# Patient Record
Sex: Female | Born: 1965 | Hispanic: Refuse to answer | Smoking: Never smoker
Health system: Southern US, Community
[De-identification: ages and names within clinical notes are randomized; demographics above are authoritative.]

## PROBLEM LIST (undated history)

## (undated) HISTORY — PX: NO PAST SURGERIES: SHX2092

---

## 2017-01-31 ENCOUNTER — Ambulatory Visit
Admission: EM | Admit: 2017-01-31 | Discharge: 2017-01-31 | Disposition: A | Discharge status: Home / Self Care | Payer: Worker's Compensation | Attending: Family Medicine | Admitting: Family Medicine

## 2017-01-31 ENCOUNTER — Ambulatory Visit (INDEPENDENT_AMBULATORY_CARE_PROVIDER_SITE_OTHER): Payer: Worker's Compensation

## 2017-01-31 DIAGNOSIS — S8001XA Contusion of right knee, initial encounter: Secondary | ICD-10-CM

## 2017-01-31 DIAGNOSIS — M25561 Pain in right knee: Secondary | ICD-10-CM

## 2017-01-31 DIAGNOSIS — W19XXXA Unspecified fall, initial encounter: Secondary | ICD-10-CM | POA: Diagnosis not present

## 2017-01-31 MED ORDER — TRAMADOL HCL 50 MG PO TABS: 50.0000 mg | ORAL_TABLET | Freq: Every evening | ORAL | 0 refills | Status: DC | PRN

## 2017-01-31 MED ORDER — MELOXICAM 15 MG PO TABS: 15.0000 mg | ORAL_TABLET | Freq: Every day | ORAL | 0 refills | Status: DC | PRN

## 2017-01-31 NOTE — ED Provider Notes (Signed)
[facility] ____________________________________________  Time seen: Approximately 2:20 PM  I have reviewed the triage vital signs and the nursing notes.   HISTORY  Chief Complaint Work Related Injury; Knee Pain (right); and Elbow Pain  Spanish interpreter used  HPI Tina Coleman is a 51 y.o. female presenting for evaluation of right knee pain. 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 Denies other pain or injury. Denies head injury or loss of consciousness. Reports pain only to right knee. States current right knee pain is mild, moderate with active weightbearing or direct palpation. States right knee is swollen since injury. Denies previous knee issues. States has continued to weight-bear and ambulate but with the pain. Denies feeling the need of crutches. No medications taken over-the-counter prior to arrival. Denies other aggravating or alleviating factors. Reports otherwise feels well.  Denies chest pain, shortness of breath, abdominal pain, or rash. Denies recent sickness. Denies recent antibiotic use.   No LMP recorded. Patient is postmenopausal.   History reviewed. No pertinent past medical history.  There are no active problems to display for this patient.   Past Surgical History:  Procedure Laterality Date  . NO PAST SURGERIES       No current facility-administered medications for this encounter.   Current Outpatient Prescriptions:  .  meloxicam (MOBIC) 15 MG tablet, Take 1 tablet (15 mg total) by mouth daily as needed., Disp: 10 tablet, Rfl: 0 .  traMADol (ULTRAM) 50 MG tablet, Take 1 tablet (50 mg total) by mouth at bedtime as needed (Do not drive or operate machinery while taking as can cause drowsiness.)., Disp: 8 tablet, Rfl: 0  Allergies Patient has no known allergies.  History reviewed. No  pertinent family history.  Social History Social History  Substance Use Topics  . Smoking status: Never Smoker  . Smokeless tobacco: Never Used  . Alcohol use Yes     Comment: occasionally    Review of Systems Constitutional: No fever/chills Cardiovascular: Denies chest pain. Respiratory: Denies shortness of breath. Gastrointestinal: No abdominal pain.   Genitourinary: Negative for dysuria. Musculoskeletal: Negative for back pain.As above. Skin: Negative for rash.   ____________________________________________   PHYSICAL EXAM:  VITAL SIGNS: ED Triage Vitals [01/31/17 1406]  Enc Vitals Group     BP (!) 153/93     Pulse Rate 83     Resp 17     Temp 98.4 F (36.9 C)     Temp Source Oral     SpO2 100 %     Weight 174 lb (78.9 kg)     Height      Head Circumference      Peak Flow      Pain Score 6     Pain Loc      Pain Edu?      Excl. in GC?     Constitutional: Alert and oriented. Well appearing and in no acute distress. Cardiovascular: Normal rate, regular rhythm. Grossly normal heart sounds.  Good peripheral circulation. Respiratory: Normal respiratory effort without tachypnea nor retractions. Breath sounds are clear and equal bilaterally. No wheezes, rales, rhonchi. Musculoskeletal: No midline cervical, thoracic or lumbar tenderness to palpation. Bilateral pedal pulses equal and easily palpated.  except: Right anterior knee with diffuse tenderness and mild edema, tenderness more focal at mild to moderate on anterior medial aspect and along  the lower patella, pain with resisted knee flexion and extension but full range of motion present, no pain with anterior or posterior drawer test, no medial or lateral stress pain. Ambulatory with mild antalgic gait. Right lower me otherwise nontender. Neurologic:  Normal speech and language. Speech is normal. No gait instability.  Skin:  Skin is warm, dry. Psychiatric: Mood and affect are normal. Speech and behavior are normal.  Patient exhibits appropriate insight and judgment   ___________________________________________   LABS (all labs ordered are listed, but only abnormal results are displayed)  Labs Reviewed - No data to display  RADIOLOGY  Dg Knee Complete 4 Views Right  Result Date: 01/31/2017 CLINICAL DATA:  Right knee pain.  Fall. EXAM: RIGHT KNEE - COMPLETE 4+ VIEW COMPARISON:  None. FINDINGS: No evidence of fracture, dislocation, or joint effusion. No evidence of arthropathy or other focal bone abnormality. Soft tissues are unremarkable. IMPRESSION: Negative. Electronically Signed   By: Charlett Nose M.D.   On: 01/31/2017 14:58   ____________________________________________   PROCEDURES Procedures   INITIAL IMPRESSION / ASSESSMENT AND PLAN / ED COURSE  Pertinent labs & imaging results that were available during my care of the patient were reviewed by me and considered in my medical decision making (see chart for details).  Very well-appearing patient. No acute distress. Presents for evaluation of right knee pain post mechanical injury that occurred at work prior to arrival. 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 Denies other pain or injury. Will evaluate right knee x-ray.  Right knee x-ray negative per radiologist. Discussed with patient contusion and strain injuries likely. Encouraged rest, ice and elevation. Will apply a knee immobilizer. Will start patient on oral daily Mobic and when necessary tramadol at night for breakthrough pain. Discussed no bending, climbing or squatting with right leg  And to continue knee immobilizer for 3 days and until follow-up with Tommie Maximiano Coss NP. Encouraged rest, fluids and supportive care.   [place] controlled substance database reviewed and no recent controlled medications documented.  Discussed follow up and return parameters including no resolution or any worsening concerns. Patient verbalized understanding and agreed to plan.    ____________________________________________   FINAL CLINICAL IMPRESSION(S) / ED DIAGNOSES  Final diagnoses:  Acute pain of right knee  Contusion of right knee, initial encounter     New Prescriptions   MELOXICAM (MOBIC) 15 MG TABLET    Take 1 tablet (15 mg total) by mouth daily as needed.   TRAMADOL (ULTRAM) 50 MG TABLET    Take 1 tablet (50 mg total) by mouth at bedtime as needed (Do not drive or operate machinery while taking as can cause drowsiness.).    Note: This dictation was prepared with Dragon dictation along with smaller phrase technology. Any transcriptional errors that result from this process are unintentional.          Renford Dills, NP 01/31/17 1604

## 2017-01-31 NOTE — Discharge Instructions (Signed)
Take medication as prescribed. Rest. Drink plenty of fluids. Apply ice and elevate.   Follow up with Tommie Maximiano CossAnne Moore NP in 3 days. Wear knee splint until follow up, with except of showering and stretching.   Follow up with your primary care physician this week as needed. Return to Urgent care for new or worsening concerns.

## 2017-01-31 NOTE — ED Triage Notes (Signed)
Patient complains of right knee pain after a fall at work today while picking up a box while putting it on top of another box. Patient states that this occurred around 11:45am.

## 2017-02-21 ENCOUNTER — Other Ambulatory Visit: Payer: Self-pay | Admitting: Obstetrics and Gynecology

## 2017-02-21 DIAGNOSIS — Z1231 Encounter for screening mammogram for malignant neoplasm of breast: Secondary | ICD-10-CM

## 2017-03-07 ENCOUNTER — Ambulatory Visit
Admission: RE | Admit: 2017-03-07 | Discharge: 2017-03-07 | Disposition: A | Discharge status: Home / Self Care | Payer: 59 | Source: Ambulatory Visit | Attending: Obstetrics and Gynecology | Admitting: Obstetrics and Gynecology

## 2017-03-07 DIAGNOSIS — Z1231 Encounter for screening mammogram for malignant neoplasm of breast: Secondary | ICD-10-CM | POA: Insufficient documentation

## 2017-09-19 ENCOUNTER — Ambulatory Visit: Payer: 59 | Admitting: Anesthesiology

## 2017-09-19 ENCOUNTER — Encounter
Admission: RE | Disposition: A | Discharge status: Home / Self Care | Payer: Self-pay | Source: Ambulatory Visit | Attending: Unknown Physician Specialty

## 2017-09-19 ENCOUNTER — Ambulatory Visit
Admission: RE | Admit: 2017-09-19 | Discharge: 2017-09-19 | Disposition: A | Discharge status: Home / Self Care | Payer: 59 | Source: Ambulatory Visit | Attending: Unknown Physician Specialty | Admitting: Unknown Physician Specialty

## 2017-09-19 DIAGNOSIS — K5730 Diverticulosis of large intestine without perforation or abscess without bleeding: Secondary | ICD-10-CM | POA: Diagnosis not present

## 2017-09-19 DIAGNOSIS — K640 First degree hemorrhoids: Secondary | ICD-10-CM | POA: Diagnosis not present

## 2017-09-19 DIAGNOSIS — Z1211 Encounter for screening for malignant neoplasm of colon: Secondary | ICD-10-CM | POA: Insufficient documentation

## 2017-09-19 HISTORY — PX: COLONOSCOPY WITH PROPOFOL: SHX5780

## 2017-09-19 SURGERY — COLONOSCOPY WITH PROPOFOL
Anesthesia: General

## 2017-09-19 MED ORDER — FENTANYL CITRATE (PF) 100 MCG/2ML IJ SOLN
INTRAMUSCULAR | Status: AC
  Filled 2017-09-19: qty 2

## 2017-09-19 MED ORDER — PROPOFOL 500 MG/50ML IV EMUL
INTRAVENOUS | Status: DC | PRN
  Administered 2017-09-19: 180 ug/kg/min via INTRAVENOUS

## 2017-09-19 MED ORDER — FENTANYL CITRATE (PF) 100 MCG/2ML IJ SOLN
INTRAMUSCULAR | Status: DC | PRN
  Administered 2017-09-19: 50 ug via INTRAVENOUS

## 2017-09-19 MED ORDER — MIDAZOLAM HCL 2 MG/2ML IJ SOLN
INTRAMUSCULAR | Status: DC | PRN
  Administered 2017-09-19: 2 mg via INTRAVENOUS

## 2017-09-19 MED ORDER — SODIUM CHLORIDE 0.9 % IV SOLN: INTRAVENOUS | Status: DC

## 2017-09-19 MED ORDER — SODIUM CHLORIDE 0.9 % IV SOLN
INTRAVENOUS | Status: DC
  Administered 2017-09-19: 1000 mL via INTRAVENOUS
  Administered 2017-09-19: 12:00:00 via INTRAVENOUS

## 2017-09-19 MED ORDER — MIDAZOLAM HCL 2 MG/2ML IJ SOLN
INTRAMUSCULAR | Status: AC
  Filled 2017-09-19: qty 2

## 2017-09-19 MED ORDER — PROPOFOL 10 MG/ML IV BOLUS
INTRAVENOUS | Status: DC | PRN
  Administered 2017-09-19: 30 mg via INTRAVENOUS
  Administered 2017-09-19: 40 mg via INTRAVENOUS

## 2017-09-19 NOTE — Progress Notes (Signed)
Patient waiting on her daughter

## 2017-09-19 NOTE — H&P (Signed)
   Primary Care Physician:  Leotis ShamesSingh, Jasmine, MD Primary Gastroenterologist:  Dr. Mechele CollinElliott  Pre-Procedure History & Physical: HPI:  Tina Coleman is a 52 y.o. female is here for an colonoscopy.   No past medical history on file.  Past Surgical History:  Procedure Laterality Date  . NO PAST SURGERIES      Prior to Admission medications   Medication Sig Start Date End Date Taking? Authorizing Provider  meloxicam (MOBIC) 15 MG tablet Take 1 tablet (15 mg total) by mouth daily as needed. 01/31/17   Renford DillsMiller, Lindsey, NP  traMADol (ULTRAM) 50 MG tablet Take 1 tablet (50 mg total) by mouth at bedtime as needed (Do not drive or operate machinery while taking as can cause drowsiness.). 01/31/17   Renford DillsMiller, Lindsey, NP    Allergies as of 08/02/2017  . (No Known Allergies)    Family History  Problem Relation Age of Onset  . Breast cancer Neg Hx     Social History   Socioeconomic History  . Marital status: Divorced    Spouse name: Not on file  . Number of children: Not on file  . Years of education: Not on file  . Highest education level: Not on file  Social Needs  . Financial resource strain: Not on file  . Food insecurity - worry: Not on file  . Food insecurity - inability: Not on file  . Transportation needs - medical: Not on file  . Transportation needs - non-medical: Not on file  Occupational History  . Not on file  Tobacco Use  . Smoking status: Never Smoker  . Smokeless tobacco: Never Used  Substance and Sexual Activity  . Alcohol use: Yes    Comment: occasionally  . Drug use: No  . Sexual activity: Not on file  Other Topics Concern  . Not on file  Social History Narrative  . Not on file    Review of Systems: See HPI, otherwise negative ROS  Physical Exam: BP (!) 147/81   Pulse 78   Temp 97.7 F (36.5 C) (Tympanic)   Resp 17   Ht 5\' 5"  (1.651 m)   Wt 76.7 kg (169 lb)   SpO2 100%   BMI 28.12 kg/m  General:   Alert,  pleasant and cooperative in NAD Head:   Normocephalic and atraumatic. Neck:  Supple; no masses or thyromegaly. Lungs:  Clear throughout to auscultation.    Heart:  Regular rate and rhythm. Abdomen:  Soft, nontender and nondistended. Normal bowel sounds, without guarding, and without rebound.   Neurologic:  Alert and  oriented x4;  grossly normal neurologically.  Impression/Plan: Tina Coleman is here for an colonoscopy to be performed for colon cancer screening.  Risks, benefits, limitations, and alternatives regarding  colonoscopy have been reviewed with the patient.  Questions have been answered.  All parties agreeable.   Lynnae PrudeELLIOTT, Hagop Mccollam, MD  09/19/2017, 11:36 AM

## 2017-09-19 NOTE — Anesthesia Preprocedure Evaluation (Addendum)
Anesthesia Evaluation  Patient identified by MRN, date of birth, ID band Patient awake    Reviewed: Allergy & Precautions, H&P , NPO status , reviewed documented beta blocker date and time   Airway Mallampati: III  TM Distance: >3 FB     Dental  (+) Caps   Pulmonary    Pulmonary exam normal        Cardiovascular Normal cardiovascular exam     Neuro/Psych    GI/Hepatic   Endo/Other    Renal/GU      Musculoskeletal   Abdominal   Peds  Hematology   Anesthesia Other Findings   Reproductive/Obstetrics                            Anesthesia Physical Anesthesia Plan  ASA: II  Anesthesia Plan: General   Post-op Pain Management:    Induction:   PONV Risk Score and Plan: 3 and Propofol infusion  Airway Management Planned:   Additional Equipment:   Intra-op Plan:   Post-operative Plan:   Informed Consent: I have reviewed the patients History and Physical, chart, labs and discussed the procedure including the risks, benefits and alternatives for the proposed anesthesia with the patient or authorized representative who has indicated his/her understanding and acceptance.   Dental Advisory Given  Plan Discussed with: CRNA  Anesthesia Plan Comments:         Anesthesia Quick Evaluation

## 2017-09-19 NOTE — Anesthesia Postprocedure Evaluation (Signed)
Anesthesia Post Note  Patient: Etta QuillMaria Takemoto  Procedure(s) Performed: COLONOSCOPY WITH PROPOFOL (N/A )  Patient location during evaluation: Endoscopy Anesthesia Type: General Level of consciousness: awake and alert Pain management: pain level controlled Vital Signs Assessment: post-procedure vital signs reviewed and stable Respiratory status: spontaneous breathing, nonlabored ventilation, respiratory function stable and patient connected to nasal cannula oxygen Cardiovascular status: blood pressure returned to baseline and stable Postop Assessment: no apparent nausea or vomiting Anesthetic complications: no     Last Vitals:  Vitals:   09/19/17 1231 09/19/17 1241  BP: (!) 110/50 127/77  Pulse: 67 60  Resp: (!) 27 13  Temp:    SpO2: 100% 100%    Last Pain:  Vitals:   09/19/17 1044  TempSrc: Tympanic                 Dalayza Zambrana Garry Heater Derrica Sieg

## 2017-09-19 NOTE — Anesthesia Post-op Follow-up Note (Signed)
Anesthesia QCDR form completed.        

## 2017-09-19 NOTE — Transfer of Care (Signed)
Immediate Anesthesia Transfer of Care Note  Patient: Tina Coleman  Procedure(s) Performed: COLONOSCOPY WITH PROPOFOL (N/A )  Patient Location: PACU  Anesthesia Type:General  Level of Consciousness: awake  Airway & Oxygen Therapy: Patient Spontanous Breathing and Patient connected to nasal cannula oxygen  Post-op Assessment: Report given to RN and Post -op Vital signs reviewed and stable  Post vital signs: Reviewed and stable  Last Vitals:  Vitals:   09/19/17 1044  BP: (!) 147/81  Pulse: 78  Resp: 17  Temp: 36.5 C  SpO2: 100%    Last Pain:  Vitals:   09/19/17 1044  TempSrc: Tympanic         Complications: No apparent anesthesia complications

## 2017-09-19 NOTE — Anesthesia Procedure Notes (Signed)
Date/Time: 09/19/2017 11:50 AM Performed by: Henrietta HooverPope, Cannon Arreola, CRNA Pre-anesthesia Checklist: Patient identified, Emergency Drugs available, Suction available, Patient being monitored and Timeout performed Oxygen Delivery Method: Nasal cannula Placement Confirmation: positive ETCO2

## 2017-09-19 NOTE — Op Note (Addendum)
[facility] Gastroenterology Patient Name: Tina QuillMaria Coleman Procedure Date: 09/19/2017 11:36 AM MRN: [id number] Account #: [id number] Date of Birth: 08/21/1965 Admit Type: Outpatient Age: [age] [facility] 3 Gender: Female Note Status: Finalized Procedure:            Colonoscopy Indications:          Screening for colorectal malignant neoplasm Providers:            Scot Junobert T. Danna Sewell, MD Referring MD:         Leotis ShamesJasmine Singh (Referring MD) Medicines:            Propofol per Anesthesia Complications:        No immediate complications. Procedure:            Pre-Anesthesia Assessment:                       - After reviewing the risks and benefits, the patient                        was deemed in satisfactory condition to undergo the                        procedure.                       After obtaining informed consent, the colonoscope was                        passed under direct vision. Throughout the procedure,                        the patient's blood pressure, pulse, and oxygen                        saturations were monitored continuously. The                        Colonoscope was introduced through the anus and                        advanced to the the cecum, identified by appendiceal                        orifice and ileocecal valve. The colonoscopy was                        performed without difficulty. The patient tolerated the                        procedure well. The quality of the bowel preparation                        was excellent. Findings:      A few very small-mouthed diverticula were found in the sigmoid colon.      Internal hemorrhoids were found during endoscopy. The hemorrhoids were       small and Grade I (internal hemorrhoids that do not prolapse).      The exam was otherwise without abnormality. Impression:           - Diverticulosis in the sigmoid colon.                       -  Internal hemorrhoids.                       - The  examination was otherwise normal.                       - No specimens collected. Recommendation:       - Repeat colonoscopy in 10 years for screening                        purposes. Repeat in 5 years if a close relative has                        colon polyps or colon cancer. Scot Jun, MD 09/19/2017 12:06:38 PM This report has been signed electronically. Number of Addenda: 0 Note Initiated On: 09/19/2017 11:36 AM Scope Withdrawal Time: 0 hours 6 minutes 16 seconds  Total Procedure Duration: 0 hours 13 minutes 10 seconds       [facility]

## 2017-09-20 ENCOUNTER — Encounter: Payer: Self-pay | Admitting: Unknown Physician Specialty

## 2018-08-09 ENCOUNTER — Other Ambulatory Visit: Payer: Self-pay | Admitting: Internal Medicine

## 2018-08-09 DIAGNOSIS — Z1231 Encounter for screening mammogram for malignant neoplasm of breast: Secondary | ICD-10-CM

## 2018-08-30 ENCOUNTER — Ambulatory Visit
Admission: RE | Admit: 2018-08-30 | Discharge: 2018-08-30 | Disposition: A | Discharge status: Home / Self Care | Payer: BLUE CROSS/BLUE SHIELD | Source: Ambulatory Visit | Attending: Internal Medicine | Admitting: Internal Medicine

## 2018-08-30 DIAGNOSIS — Z1231 Encounter for screening mammogram for malignant neoplasm of breast: Secondary | ICD-10-CM | POA: Insufficient documentation

## 2020-10-28 ENCOUNTER — Other Ambulatory Visit: Payer: Self-pay | Admitting: Internal Medicine

## 2020-10-28 DIAGNOSIS — Z1231 Encounter for screening mammogram for malignant neoplasm of breast: Secondary | ICD-10-CM

## 2020-11-29 ENCOUNTER — Other Ambulatory Visit: Payer: Self-pay

## 2020-11-29 ENCOUNTER — Ambulatory Visit
Admission: RE | Admit: 2020-11-29 | Discharge: 2020-11-29 | Disposition: A | Discharge status: Home / Self Care | Payer: BC Managed Care – PPO | Source: Ambulatory Visit | Attending: Internal Medicine | Admitting: Internal Medicine

## 2020-11-29 DIAGNOSIS — Z1231 Encounter for screening mammogram for malignant neoplasm of breast: Secondary | ICD-10-CM | POA: Diagnosis not present

## 2021-12-28 ENCOUNTER — Other Ambulatory Visit: Payer: Self-pay | Admitting: Internal Medicine

## 2021-12-28 DIAGNOSIS — Z1231 Encounter for screening mammogram for malignant neoplasm of breast: Secondary | ICD-10-CM

## 2022-02-01 ENCOUNTER — Ambulatory Visit
Admission: RE | Admit: 2022-02-01 | Discharge: 2022-02-01 | Disposition: A | Discharge status: Home / Self Care | Payer: BC Managed Care – PPO | Source: Ambulatory Visit | Attending: Internal Medicine | Admitting: Internal Medicine

## 2022-02-01 DIAGNOSIS — Z1231 Encounter for screening mammogram for malignant neoplasm of breast: Secondary | ICD-10-CM | POA: Insufficient documentation

## 2022-02-17 ENCOUNTER — Other Ambulatory Visit: Payer: Self-pay | Admitting: Internal Medicine

## 2022-02-17 DIAGNOSIS — N63 Unspecified lump in unspecified breast: Secondary | ICD-10-CM

## 2022-02-17 DIAGNOSIS — R928 Other abnormal and inconclusive findings on diagnostic imaging of breast: Secondary | ICD-10-CM

## 2022-03-06 ENCOUNTER — Other Ambulatory Visit: Payer: BC Managed Care – PPO

## 2022-10-14 IMAGING — MG MM DIGITAL SCREENING BILAT W/ TOMO AND CAD
8 series · 8 of 24 positions shown · non-contrast
Comparison: Previous exam(s).

CLINICAL DATA: Screening.

EXAM:
DIGITAL SCREENING BILATERAL MAMMOGRAM WITH TOMOSYNTHESIS AND CAD
TECHNIQUE: Bilateral screening digital craniocaudal and mediolateral oblique
mammograms were obtained. Bilateral screening digital breast
tomosynthesis was performed. The images were evaluated with
computer-aided detection.

[R CC synth-2D]
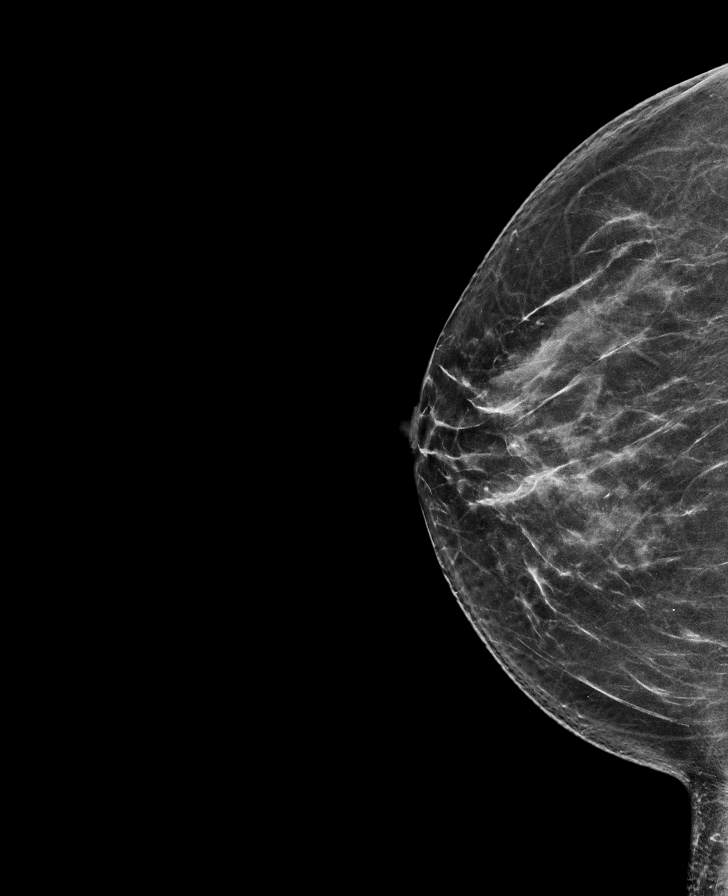

[L MLO synth-2D]
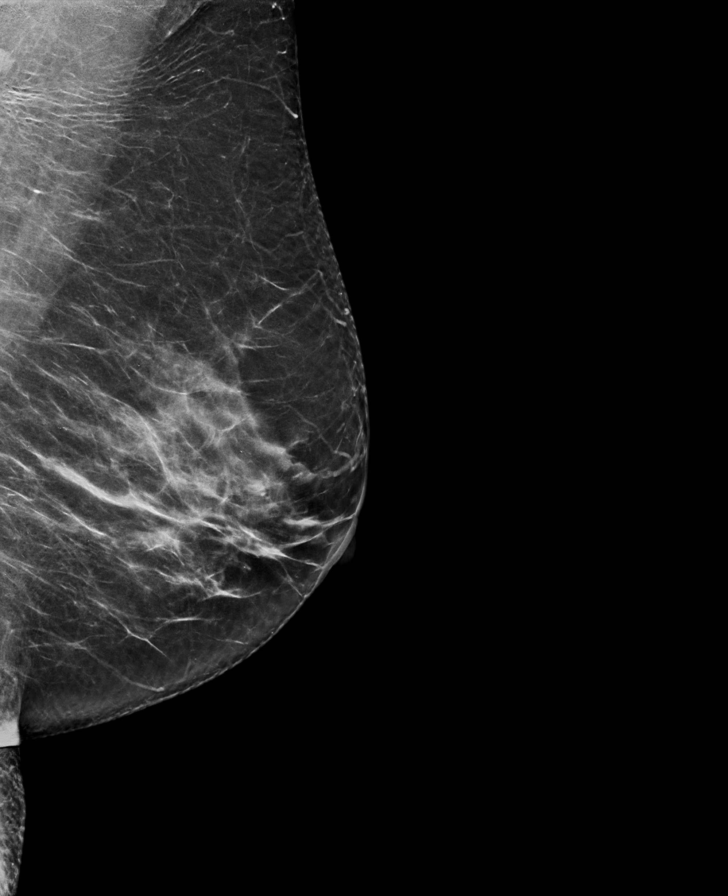

[L CC synth-2D]
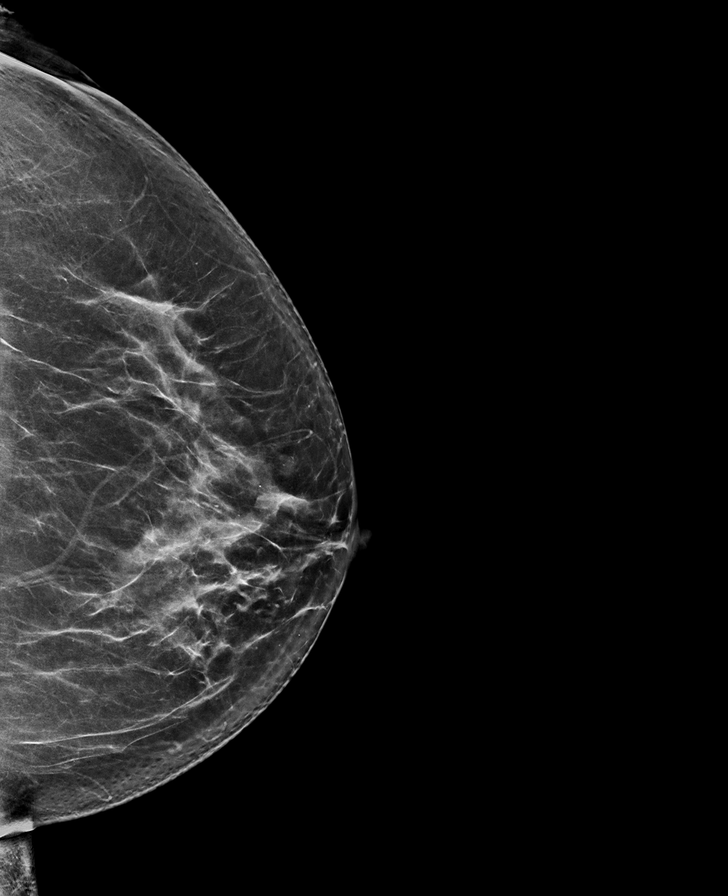

[R MLO synth-2D]
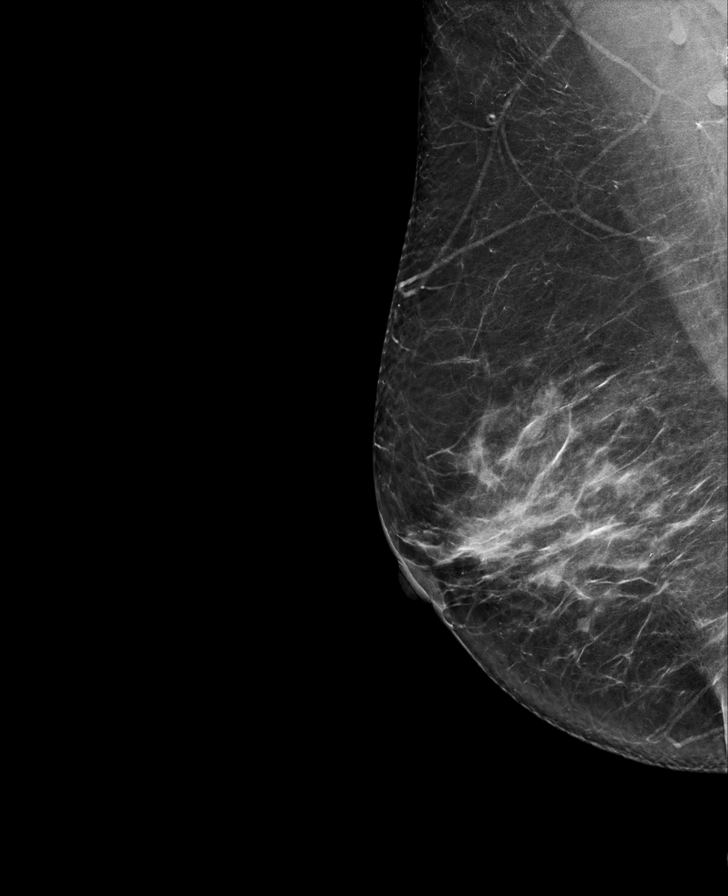

[L CC tomo · tomo slice 37/74.0]
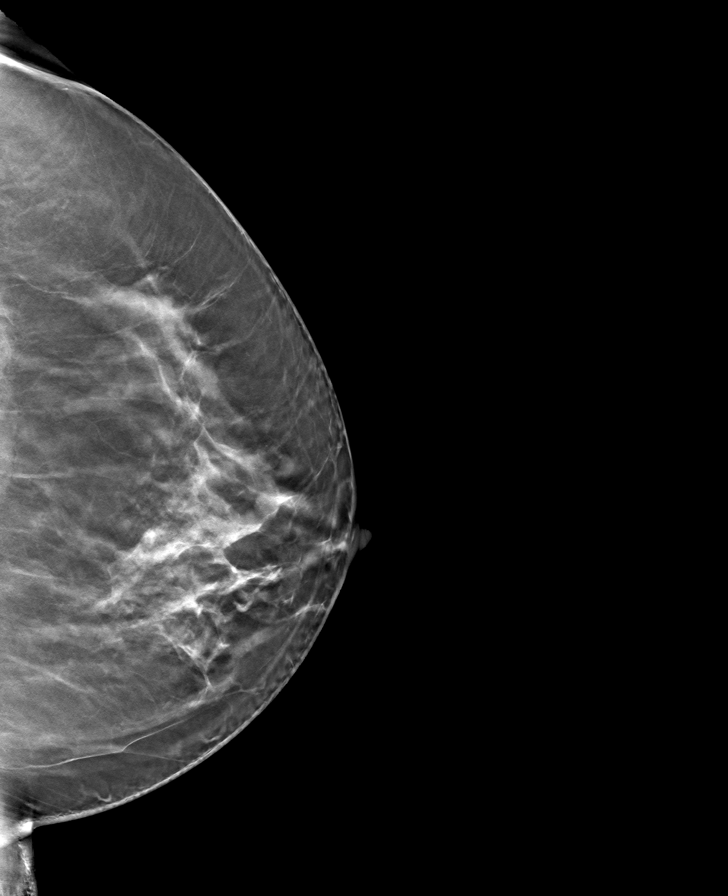

[L MLO tomo · tomo slice 37/74.0]
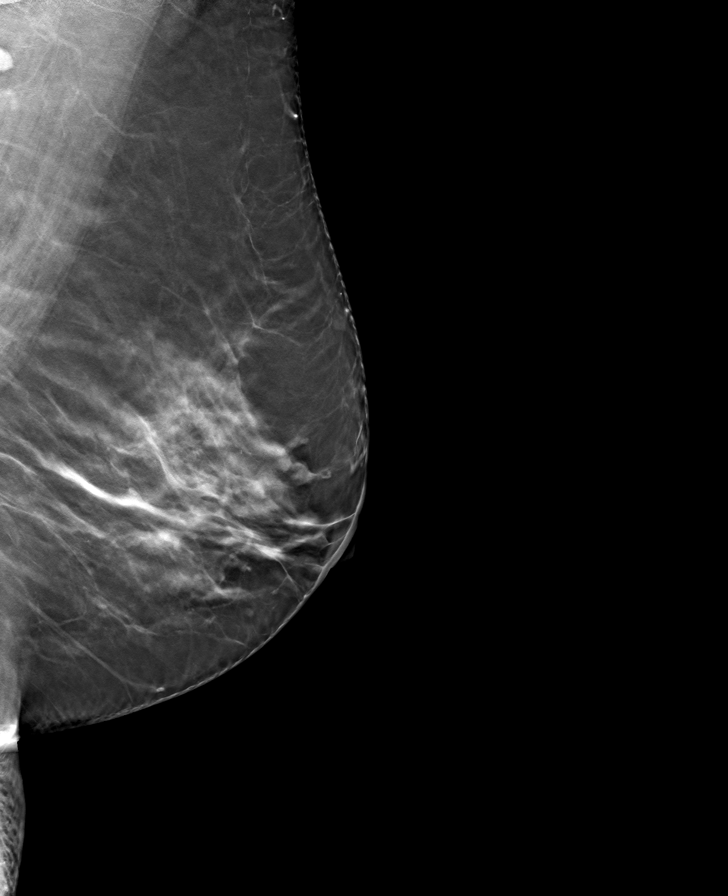

[R CC tomo · tomo slice 35/70.0]
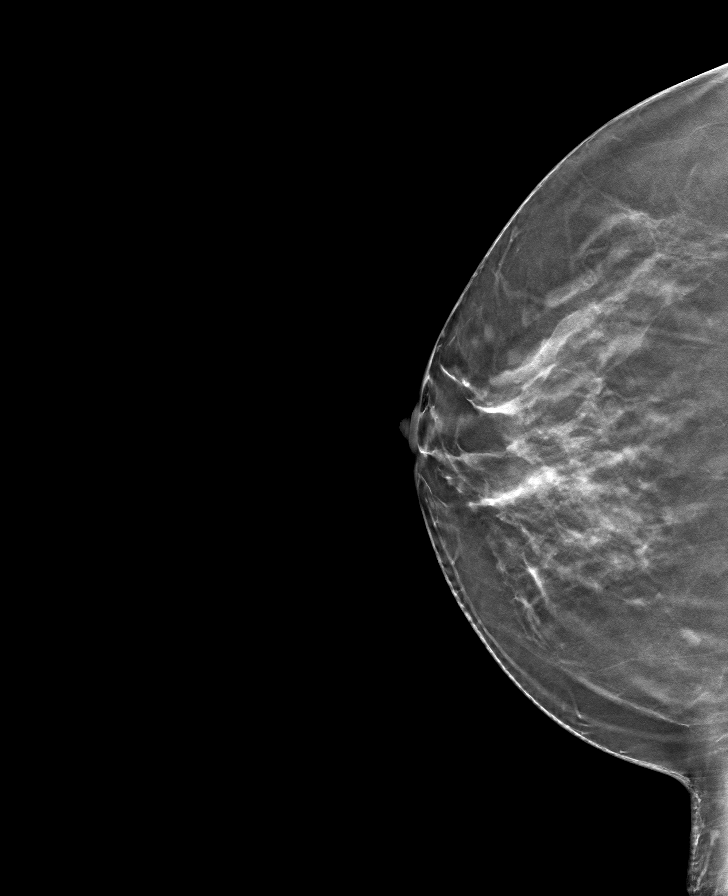

[R MLO tomo · tomo slice 37/73.0]
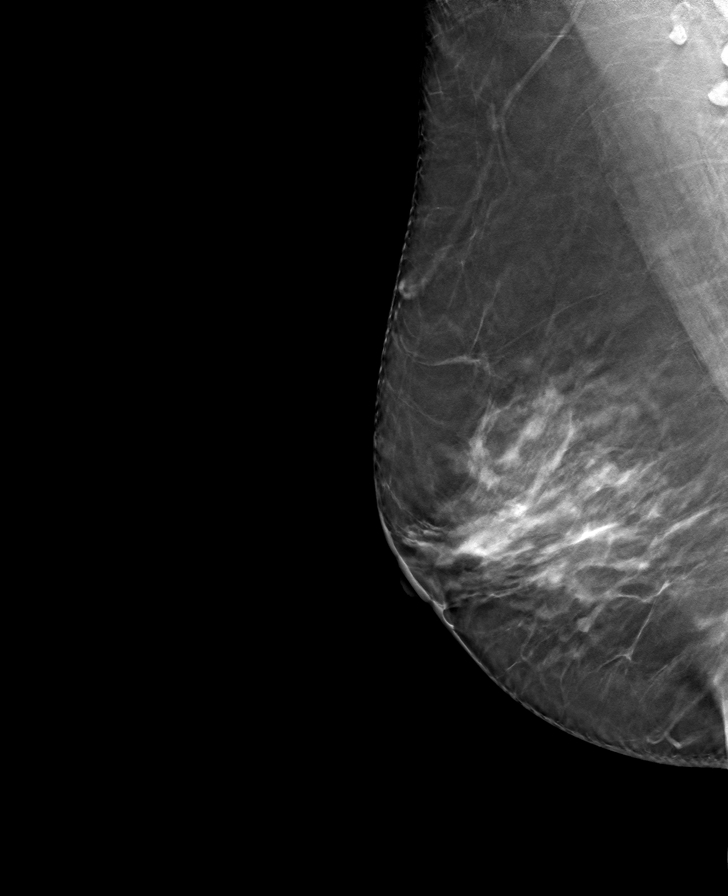

[8 of 24 positions shown; findings below may reference images not displayed]

ACR Breast Density Category c: The breast tissue is heterogeneously
dense, which may obscure small masses.
FINDINGS: There are no findings suspicious for malignancy. The images were
evaluated with computer-aided detection.
IMPRESSION: No mammographic evidence of malignancy. A result letter of this
screening mammogram will be mailed directly to the patient.

RECOMMENDATION:
Screening mammogram in one year. (Code:T4-5-GWO)

BI-RADS CATEGORY  1: Negative.

## 2022-11-30 DIAGNOSIS — L089 Local infection of the skin and subcutaneous tissue, unspecified: Secondary | ICD-10-CM | POA: Diagnosis not present

## 2022-11-30 DIAGNOSIS — L03116 Cellulitis of left lower limb: Secondary | ICD-10-CM | POA: Diagnosis not present

## 2022-11-30 DIAGNOSIS — T07XXXA Unspecified multiple injuries, initial encounter: Secondary | ICD-10-CM | POA: Diagnosis not present

## 2022-11-30 DIAGNOSIS — W57XXXA Bitten or stung by nonvenomous insect and other nonvenomous arthropods, initial encounter: Secondary | ICD-10-CM | POA: Diagnosis not present

## 2023-01-02 DIAGNOSIS — Z780 Asymptomatic menopausal state: Secondary | ICD-10-CM | POA: Diagnosis not present

## 2023-01-02 DIAGNOSIS — Z0000 Encounter for general adult medical examination without abnormal findings: Secondary | ICD-10-CM | POA: Diagnosis not present

## 2023-01-02 DIAGNOSIS — E782 Mixed hyperlipidemia: Secondary | ICD-10-CM | POA: Diagnosis not present

## 2023-01-25 DIAGNOSIS — Z124 Encounter for screening for malignant neoplasm of cervix: Secondary | ICD-10-CM | POA: Diagnosis not present

## 2023-01-25 DIAGNOSIS — Z01419 Encounter for gynecological examination (general) (routine) without abnormal findings: Secondary | ICD-10-CM | POA: Diagnosis not present

## 2023-01-25 DIAGNOSIS — Z1331 Encounter for screening for depression: Secondary | ICD-10-CM | POA: Diagnosis not present

## 2023-02-12 ENCOUNTER — Other Ambulatory Visit: Payer: Self-pay | Admitting: Internal Medicine

## 2023-02-12 DIAGNOSIS — Z1231 Encounter for screening mammogram for malignant neoplasm of breast: Secondary | ICD-10-CM

## 2023-02-21 ENCOUNTER — Encounter: Payer: Self-pay | Admitting: Internal Medicine

## 2023-02-21 ENCOUNTER — Ambulatory Visit
Admission: RE | Admit: 2023-02-21 | Discharge: 2023-02-21 | Disposition: A | Discharge status: Home / Self Care | Payer: 59 | Source: Ambulatory Visit | Attending: Internal Medicine | Admitting: Internal Medicine

## 2023-02-21 DIAGNOSIS — Z1231 Encounter for screening mammogram for malignant neoplasm of breast: Secondary | ICD-10-CM | POA: Insufficient documentation

## 2023-03-01 ENCOUNTER — Other Ambulatory Visit: Payer: Self-pay | Admitting: Internal Medicine

## 2023-03-01 DIAGNOSIS — N63 Unspecified lump in unspecified breast: Secondary | ICD-10-CM

## 2023-03-07 ENCOUNTER — Other Ambulatory Visit: Payer: Self-pay | Admitting: Internal Medicine

## 2023-03-07 DIAGNOSIS — N63 Unspecified lump in unspecified breast: Secondary | ICD-10-CM

## 2023-03-08 ENCOUNTER — Inpatient Hospital Stay
Admission: RE | Admit: 2023-03-08 | Discharge: 2023-03-08 | Disposition: A | Discharge status: Home / Self Care | Payer: Self-pay | Source: Ambulatory Visit | Attending: Internal Medicine | Admitting: Internal Medicine

## 2023-03-08 ENCOUNTER — Other Ambulatory Visit: Payer: Self-pay | Admitting: *Deleted

## 2023-03-08 DIAGNOSIS — Z1231 Encounter for screening mammogram for malignant neoplasm of breast: Secondary | ICD-10-CM

## 2023-03-22 ENCOUNTER — Other Ambulatory Visit: Payer: 59

## 2023-03-22 ENCOUNTER — Ambulatory Visit
Admission: RE | Admit: 2023-03-22 | Discharge: 2023-03-22 | Disposition: A | Discharge status: Home / Self Care | Payer: Self-pay | Source: Ambulatory Visit | Attending: Internal Medicine | Admitting: Internal Medicine

## 2023-03-22 DIAGNOSIS — Z1231 Encounter for screening mammogram for malignant neoplasm of breast: Secondary | ICD-10-CM | POA: Insufficient documentation

## 2023-03-22 DIAGNOSIS — N63 Unspecified lump in unspecified breast: Secondary | ICD-10-CM | POA: Insufficient documentation

## 2023-06-27 DIAGNOSIS — R600 Localized edema: Secondary | ICD-10-CM | POA: Diagnosis not present

## 2023-06-27 DIAGNOSIS — I872 Venous insufficiency (chronic) (peripheral): Secondary | ICD-10-CM | POA: Diagnosis not present

## 2023-06-27 DIAGNOSIS — E782 Mixed hyperlipidemia: Secondary | ICD-10-CM | POA: Diagnosis not present

## 2023-06-27 DIAGNOSIS — Z23 Encounter for immunization: Secondary | ICD-10-CM | POA: Diagnosis not present

## 2023-06-27 DIAGNOSIS — I83893 Varicose veins of bilateral lower extremities with other complications: Secondary | ICD-10-CM | POA: Diagnosis not present

## 2023-07-06 ENCOUNTER — Other Ambulatory Visit: Payer: Self-pay | Admitting: Internal Medicine

## 2023-07-06 DIAGNOSIS — I872 Venous insufficiency (chronic) (peripheral): Secondary | ICD-10-CM

## 2023-07-14 DIAGNOSIS — H81391 Other peripheral vertigo, right ear: Secondary | ICD-10-CM | POA: Diagnosis not present

## 2023-07-17 ENCOUNTER — Ambulatory Visit
Admission: RE | Admit: 2023-07-17 | Discharge: 2023-07-17 | Disposition: A | Discharge status: Home / Self Care | Payer: 59 | Source: Ambulatory Visit | Attending: Internal Medicine | Admitting: Internal Medicine

## 2023-07-17 DIAGNOSIS — M7989 Other specified soft tissue disorders: Secondary | ICD-10-CM | POA: Diagnosis not present

## 2023-07-17 DIAGNOSIS — I872 Venous insufficiency (chronic) (peripheral): Secondary | ICD-10-CM | POA: Insufficient documentation

## 2024-01-29 ENCOUNTER — Other Ambulatory Visit: Payer: Self-pay | Admitting: Certified Nurse Midwife

## 2024-01-29 DIAGNOSIS — Z1231 Encounter for screening mammogram for malignant neoplasm of breast: Secondary | ICD-10-CM

## 2024-07-27 ENCOUNTER — Ambulatory Visit: Payer: Self-pay

## 2024-07-27 ENCOUNTER — Ambulatory Visit

## 2024-07-27 ENCOUNTER — Ambulatory Visit
Admission: EM | Admit: 2024-07-27 | Discharge: 2024-07-27 | Disposition: A | Discharge status: Home / Self Care | Payer: Worker's Compensation | Attending: Physician Assistant | Admitting: Physician Assistant

## 2024-07-27 DIAGNOSIS — S8265XA Nondisplaced fracture of lateral malleolus of left fibula, initial encounter for closed fracture: Secondary | ICD-10-CM | POA: Diagnosis not present

## 2024-07-27 DIAGNOSIS — T07XXXA Unspecified multiple injuries, initial encounter: Secondary | ICD-10-CM | POA: Diagnosis not present

## 2024-07-27 DIAGNOSIS — W19XXXA Unspecified fall, initial encounter: Secondary | ICD-10-CM | POA: Diagnosis not present

## 2024-07-27 DIAGNOSIS — M25572 Pain in left ankle and joints of left foot: Secondary | ICD-10-CM

## 2024-07-27 DIAGNOSIS — Z23 Encounter for immunization: Secondary | ICD-10-CM

## 2024-07-27 DIAGNOSIS — M79672 Pain in left foot: Secondary | ICD-10-CM | POA: Diagnosis not present

## 2024-07-27 MED ORDER — MUPIROCIN 2 % EX OINT: 1.0000 | TOPICAL_OINTMENT | Freq: Two times a day (BID) | CUTANEOUS | 0 refills | Status: AC

## 2024-07-27 MED ORDER — TETANUS-DIPHTH-ACELL PERTUSSIS 5-2-15.5 LF-MCG/0.5 IM SUSP
0.5000 mL | Freq: Once | INTRAMUSCULAR | Status: AC
  Administered 2024-07-27: 0.5 mL via INTRAMUSCULAR

## 2024-07-27 MED ORDER — IBUPROFEN 600 MG PO TABS: 600.0000 mg | ORAL_TABLET | Freq: Three times a day (TID) | ORAL | 0 refills | Status: AC | PRN

## 2024-07-27 MED ORDER — HYDROCODONE-ACETAMINOPHEN 5-325 MG PO TABS: 0.5000 | ORAL_TABLET | Freq: Two times a day (BID) | ORAL | 0 refills | Status: AC | PRN

## 2024-07-27 NOTE — ED Triage Notes (Signed)
 Marzelle reports she fell at work this morning, fell on left side, pain in left heel and left knee. Scratch on right knee. Treated with Tylenol .

## 2024-07-27 NOTE — ED Provider Notes (Signed)
 " [facility]    CSN: [id number] Arrival date & time: 07/27/24  0920      History   Chief Complaint No chief complaint on file.   HPI Tina Coleman is a 58 y.o. female.   Patient presents today companied by her daughter.  Both patient and daughter speak Spanish interpreting interpreter was utilized during the entirety of the visit.  Reports that earlier today she was going up the stairs at work when she took a misstep and fell onto her left side.  She did not fall down the stairs and reports that she did not hit her head.  She denies any loss of consciousness, headache, dizziness, nausea, vomiting.  Does not take any blood thinning medication.  She did sustain several abrasions on her lower extremities but is complaining mostly of pain in her left ankle and foot.  She reports the pain is rated 8 on a 0-10 pain scale, described as sharp, worse with attempted ambulation, no alleviating factors identified.  She has tried Tylenol  without improvement of symptoms.  She denies previous injury or surgery involving her ankle or foot.  Denies any numbness or paresthesias.  She is ambulating with a cane but able to bear weight.  She is unsure when her last tetanus vaccine was given.    History reviewed. No pertinent past medical history.  There are no active problems to display for this patient.   Past Surgical History:  Procedure Laterality Date   COLONOSCOPY WITH PROPOFOL  N/A 09/19/2017   Procedure: COLONOSCOPY WITH PROPOFOL ;  Surgeon: Viktoria Lamar DASEN, MD;  Location: [facility] ENDOSCOPY;  Service: Endoscopy;  Laterality: N/A;   NO PAST SURGERIES      OB History   No obstetric history on file.      Home Medications    Prior to Admission medications  Medication Sig Start Date End Date Taking? Authorizing Provider  HYDROcodone -acetaminophen  (NORCO/VICODIN) 5-325 MG tablet Take 0.5-1 tablets by mouth 2 (two) times daily as needed. 07/27/24  Yes Karra Pink K, PA-C  ibuprofen   (ADVIL ) 600 MG tablet Take 1 tablet (600 mg total) by mouth every 8 (eight) hours as needed. 07/27/24  Yes Kodee Ravert K, PA-C  mupirocin  ointment (BACTROBAN ) 2 % Apply 1 Application topically 2 (two) times daily. 07/27/24  Yes Tadhg Eskew K, PA-C  rosuvastatin (CRESTOR) 10 MG tablet Take 10 mg by mouth daily. 02/18/24  Yes [provider]    Family History Family History  Problem Relation Age of Onset   Breast cancer Neg Hx     Social History Social History[1]   Allergies   Patient has no known allergies.   Review of Systems Review of Systems  Constitutional:  Positive for activity change. Negative for appetite change, fatigue and fever.  Eyes:  Negative for visual disturbance.  Gastrointestinal:  Negative for diarrhea, nausea and vomiting.  Musculoskeletal:  Positive for arthralgias, gait problem and joint swelling. Negative for myalgias.  Skin:  Positive for wound. Negative for color change.  Neurological:  Negative for dizziness, syncope, weakness, light-headedness, numbness and headaches.     Physical Exam Triage Vital Signs ED Triage Vitals  Encounter Vitals Group     BP 07/27/24 1047 (!) 146/88     Girls Systolic BP Percentile --      Girls Diastolic BP Percentile --      Boys Systolic BP Percentile --      Boys Diastolic BP Percentile --      Pulse Rate 07/27/24 1047  82     Resp 07/27/24 1047 17     Temp 07/27/24 1047 98.2 F (36.8 C)     Temp Source 07/27/24 1047 Oral     SpO2 07/27/24 1047 100 %     Weight 07/27/24 1042 188 lb (85.3 kg)     Height 07/27/24 1042 5' 3 (1.6 m)     Head Circumference --      Peak Flow --      Pain Score 07/27/24 1041 4     Pain Loc --      Pain Education --      Exclude from Growth Chart --    No data found.  Updated Vital Signs BP (!) 146/88 (BP Location: Left Arm)   Pulse 82   Temp 98.2 F (36.8 C) (Oral)   Resp 17   Ht 5' 3 (1.6 m)   Wt 188 lb (85.3 kg)   SpO2 100%   BMI 33.30 kg/m   Visual  Acuity Right Eye Distance:   Left Eye Distance:   Bilateral Distance:    Right Eye Near:   Left Eye Near:    Bilateral Near:     Physical Exam Vitals reviewed.  Constitutional:      General: She is awake. She is not in acute distress.    Appearance: Normal appearance. She is well-developed. She is not ill-appearing.     Comments: Very pleasant female appears stated age in no acute distress sitting comfortably in exam room  HENT:     Head: Normocephalic and atraumatic. No raccoon eyes, Battle's sign or contusion.     Right Ear: Tympanic membrane, ear canal and external ear normal. No hemotympanum.     Left Ear: Tympanic membrane, ear canal and external ear normal. No hemotympanum.     Nose: Nose normal.     Mouth/Throat:     Tongue: Tongue does not deviate from midline.     Pharynx: Uvula midline. No oropharyngeal exudate or posterior oropharyngeal erythema.  Eyes:     Extraocular Movements: Extraocular movements intact.     Pupils: Pupils are equal, round, and reactive to light.  Cardiovascular:     Rate and Rhythm: Normal rate and regular rhythm.     Heart sounds: Normal heart sounds, S1 normal and S2 normal. No murmur heard. Pulmonary:     Effort: Pulmonary effort is normal.     Breath sounds: Normal breath sounds. No wheezing, rhonchi or rales.     Comments: Clear to auscultation bilaterally Musculoskeletal:     Cervical back: Normal range of motion and neck supple. No tenderness or bony tenderness.     Thoracic back: No tenderness or bony tenderness.     Lumbar back: No tenderness or bony tenderness.     Comments: Knees: Abrasions noted to anterior knee bilaterally.  No focal tenderness.  No ligamentous laxity.  Left ankle/foot: Tender to palpation over lateral malleolus.  Foot is neurovascularly intact.  Several abrasions on lateral ankle.  Neurological:     General: No focal deficit present.     Mental Status: She is alert and oriented to person, place, and time.      Cranial Nerves: Cranial nerves 2-12 are intact.     Motor: Motor function is intact.     Coordination: Coordination is intact.     Gait: Gait is intact.     Comments: Cranial nerves II through XII gross intact.  No focal neurologic defect on exam.  Psychiatric:  Behavior: Behavior is cooperative.      UC Treatments / Results  Labs (all labs ordered are listed, but only abnormal results are displayed) Labs Reviewed - No data to display  EKG   Radiology DG Foot Complete Left Result Date: 07/27/2024 EXAM: 3 OR MORE VIEW(S) XRAY OF THE LEFT FOOT 07/27/2024 10:24:00 AM COMPARISON: None available. CLINICAL HISTORY: pain after fall FINDINGS: BONES AND JOINTS: No acute fracture. No malalignment. There is spurring present on the plantar surface of the calcaneus and at the insertion site of the Achilles tendon. SOFT TISSUES: The soft tissues are unremarkable. IMPRESSION: 1. No acute fracture or dislocation. 2. Plantar calcaneal spur and enthesophyte at the Achilles tendon insertion. Electronically signed by: Evalene Coho MD 07/27/2024 10:37 AM EST RP Workstation: [id number]   DG Ankle Complete Left Result Date: 07/27/2024 EXAM: 3 OR MORE VIEW(S) XRAY OF THE LEFT ANKLE 07/27/2024 10:24:00 AM CLINICAL HISTORY: fall fall COMPARISON: None available. FINDINGS: BONES AND JOINTS: There is a nondisplaced fracture of the tip of the lateral malleolus. The tibiotalar joint is unremarkable. There is spurring on the plantar surface of the calcaneus and at the insertion site of the achilles tendon. No malalignment. SOFT TISSUES: There is overlying soft tissue swelling. IMPRESSION: 1. Nondisplaced fracture of the tip of the lateral malleolus with overlying soft tissue swelling. Tibiotalar joint alignment is preserved. 2. Spurring on the plantar surface of the calcaneus and at the insertion site of the Achilles tendon. Electronically signed by: Evalene Coho MD 07/27/2024 10:36 AM EST RP Workstation:  [id number]    Procedures Procedures (including critical care time)  Medications Ordered in UC Medications  Tdap (ADACEL) injection 0.5 mL (0.5 mLs Intramuscular Given 07/27/24 1212)    Initial Impression / Assessment and Plan / UC Course  I have reviewed the triage vital signs and the nursing notes.  Pertinent labs & imaging results that were available during my care of the patient were reviewed by me and considered in my medical decision making (see chart for details).     Patient is well-appearing, afebrile, nontoxic, nontachycardic.  No indication for head or neck CT based on [place] CT rules.  X-ray of foot and ankle were obtained that showed distal fibular fracture without displacement.  Patient was placed in a cam boot given walker and we discussed that she should keep her foot elevated and apply ice 15 minutes at a time 3-4 times a day.  She does have several abrasions and so tetanus was updated.  She was encouraged to keep these clean and apply Bactroban  ointment.  We discussed that if there are any signs of infection she is to return for reevaluation.  She was given ibuprofen  for pain relief with instruction not to take additional NSAIDs with this medicine.  Notification for dose adjustment based on metabolic panel from 07/09/19 25 with a creatinine of 0.5 and calculated creatinine clearance of 165 mL/min.  She was also given several doses of hydrocodone  and we discussed that this medication is both addictive and sedating so she should try to limit use as much as possible.  Review of [place]  controlled substance database shows no inappropriate refills.  She is to follow-up as soon as possible with podiatry and was given the contact information for provider on-call.  She will call them to schedule appointment tomorrow.  We discussed that if anything worsens or changes she is to return for reevaluation.  Return precautions given.  Excuse note provided.  Final Clinical Impressions(s)  / UC  Diagnoses   Final diagnoses:  Acute left ankle pain  Left foot pain  Closed nondisplaced fracture of lateral malleolus of left fibula, initial encounter  Fall, initial encounter  Multiple abrasions     Discharge Instructions      You have broken the outside portion of your ankle.  Take ibuprofen  600 mg up to 3 times a day to help with pain.  I have also called in a few doses of hydrocodone .  This medication is both addictive and sedating so try to limit use is much as possible.  Make sure that you are taking this right before bed to prevent falls.  It is importantly follow-up with podiatry; call to schedule an appointment soon as possible.  Apply Bactroban  ointment to the abrasions on your knees and ankle to prevent infection.  If there are any signs of infection including swelling, redness, drainage you need to be seen immediately.  We updated your tetanus today.  If anything worsens and you have increasing pain, numbness or tingling in your foot, discoloration of your foot, weakness to be seen immediately.  Se ha fracturado la parte externa del tobillo. Tome ibuprofeno de 600 mg hasta tres veces al da para engineer, materials. Tambin le he recetado algunas dosis de hidrocodona. Este medicamento es adictivo y sedante, por lo que debe limitar su uso al mximo. Asegrese de tomarlo justo antes de acostarse para evitar cadas. Es importante que acuda a una cita de seguimiento con el podlogo; llame para programar una cita lo antes posible. Aplique pomada Bactroban  en las abrasiones de las rodillas y el tobillo para prevenir infecciones. Si presenta algn signo de infeccin, como hinchazn, enrojecimiento o secrecin, debe acudir al mdico de inmediato. Le hemos administrado la vacuna contra el ttanos hoy. Si los sntomas empeoran y presenta aumento del engineer, mining, entumecimiento u hormigueo en el pie, decoloracin del pie o debilidad, debe consultar con un mdico de inmediato.      ED  Prescriptions     Medication Sig Dispense Auth. Provider   mupirocin  ointment (BACTROBAN ) 2 % Apply 1 Application topically 2 (two) times daily. 22 g Nicklas Mcsweeney K, PA-C   ibuprofen  (ADVIL ) 600 MG tablet Take 1 tablet (600 mg total) by mouth every 8 (eight) hours as needed. 30 tablet Kimiya Brunelle K, PA-C   HYDROcodone -acetaminophen  (NORCO/VICODIN) 5-325 MG tablet Take 0.5-1 tablets by mouth 2 (two) times daily as needed. 4 tablet Kinslei Labine K, PA-C      I have reviewed the PDMP during this encounter.     [1]  Social History Tobacco Use   Smoking status: Never   Smokeless tobacco: Never  Vaping Use   Vaping status: Never Used  Substance Use Topics   Alcohol use: Yes    Comment: occasionally   Drug use: No     Sherrell Rocky POUR, PA-C 07/27/24 1238  "

## 2024-07-27 NOTE — Discharge Instructions (Addendum)
 You have broken the outside portion of your ankle.  Take ibuprofen  600 mg up to 3 times a day to help with pain.  I have also called in a few doses of hydrocodone .  This medication is both addictive and sedating so try to limit use is much as possible.  Make sure that you are taking this right before bed to prevent falls.  It is importantly follow-up with podiatry; call to schedule an appointment soon as possible.  Apply Bactroban  ointment to the abrasions on your knees and ankle to prevent infection.  If there are any signs of infection including swelling, redness, drainage you need to be seen immediately.  We updated your tetanus today.  If anything worsens and you have increasing pain, numbness or tingling in your foot, discoloration of your foot, weakness to be seen immediately.  Se ha fracturado la parte externa del tobillo. Tome ibuprofeno de 600 mg hasta tres veces al da para engineer, materials. Tambin le he recetado algunas dosis de hidrocodona. Este medicamento es adictivo y sedante, por lo que debe limitar su uso al mximo. Asegrese de tomarlo justo antes de acostarse para evitar cadas. Es importante que acuda a una cita de seguimiento con el podlogo; llame para programar una cita lo antes posible. Aplique pomada Bactroban  en las abrasiones de las rodillas y el tobillo para prevenir infecciones. Si presenta algn signo de infeccin, como hinchazn, enrojecimiento o secrecin, debe acudir al mdico de inmediato. Le hemos administrado la vacuna contra el ttanos hoy. Si los sntomas empeoran y presenta aumento del engineer, mining, entumecimiento u hormigueo en el pie, decoloracin del pie o debilidad, debe consultar con un mdico de inmediato.

## 2024-07-29 ENCOUNTER — Encounter: Payer: Self-pay | Admitting: Podiatry

## 2024-07-29 ENCOUNTER — Ambulatory Visit: Payer: Worker's Compensation | Admitting: Podiatry

## 2024-07-29 DIAGNOSIS — S82892A Other fracture of left lower leg, initial encounter for closed fracture: Secondary | ICD-10-CM | POA: Diagnosis not present

## 2024-07-29 NOTE — Progress Notes (Signed)
"  °  Subjective:  Patient ID: Tina Coleman, female    DOB: [birth year],  MRN: [id number]  Chief Complaint  Patient presents with   Foot Injury    Pt stated that she fell down the steps on Sunday she went to the hospital on 12/21 and had xrays     58  y.o. female presents with the above complaint.  Patient presents with left small avulsion fracture of the fibula.  She states that she fell down steps on Sunday went to the hospital had x-rays done and was discharged and sent over here she has not seen anyone as prior to seeing me denies any other acute complaints would like to discuss treatment options for this.  Pain scale is 7 out of 10 dull achy in nature hurts only fibular side no pain anywhere else on the ankle joint.   Review of Systems: Negative except as noted in the HPI. Denies N/V/F/Ch.  No past medical history on file. Current Medications[1]  Tobacco Use History[2]  Allergies[3] Objective:  There were no vitals filed for this visit. There is no height or weight on file to calculate BMI. Constitutional Well developed. Well nourished.  Vascular Dorsalis pedis pulses palpable bilaterally. Posterior tibial pulses palpable bilaterally. Capillary refill normal to all digits.  No cyanosis or clubbing noted. Pedal hair growth normal.  Neurologic Normal speech. Oriented to person, place, and time. Epicritic sensation to light touch grossly present bilaterally.  Dermatologic Nails well groomed and normal in appearance. No open wounds. No skin lesions.  Orthopedic: Pain on palpation left lateral malleolus distal tip pain with range of motion mild ecchymosis noted swelling noted.  No other abnormalities identified clinically.  No signs for compartment syndrome   Radiographs: None Assessment:   1. Closed avulsion fracture of left ankle, initial encounter    Plan:  Patient was evaluated and treated and all questions answered.  Left small avulsion fracture of the lateral malleolus I  discussed - All questions and concerns were discussed with the patient in extensive detail I discussed with her given the amount of pain that she is having she may have had inversion injury therefore she would benefit from cam boot immobilization she already has cam boot at that was given to her from urgent care discussed with her to put the cam boot for next 4 weeks she states understanding then she will transition into regular shoes.  I will see her back again in 4 weeks to reevaluate and transition her with an ankle brace  No follow-ups on file.     [1]  Current Outpatient Medications:    HYDROcodone -acetaminophen  (NORCO/VICODIN) 5-325 MG tablet, Take 0.5-1 tablets by mouth 2 (two) times daily as needed., Disp: 4 tablet, Rfl: 0   ibuprofen  (ADVIL ) 600 MG tablet, Take 1 tablet (600 mg total) by mouth every 8 (eight) hours as needed., Disp: 30 tablet, Rfl: 0   mupirocin  ointment (BACTROBAN ) 2 %, Apply 1 Application topically 2 (two) times daily., Disp: 22 g, Rfl: 0   rosuvastatin (CRESTOR) 10 MG tablet, Take 10 mg by mouth daily., Disp: , Rfl:  [2]  Social History Tobacco Use  Smoking Status Never  Smokeless Tobacco Never  [3] No Known Allergies  "

## 2024-08-26 ENCOUNTER — Ambulatory Visit: Payer: Self-pay

## 2024-08-26 ENCOUNTER — Encounter: Payer: Self-pay | Admitting: Podiatry

## 2024-08-26 ENCOUNTER — Ambulatory Visit: Payer: Worker's Compensation | Admitting: Podiatry

## 2024-08-26 DIAGNOSIS — S82892A Other fracture of left lower leg, initial encounter for closed fracture: Secondary | ICD-10-CM

## 2024-08-26 NOTE — Progress Notes (Unsigned)
 Getting better.  Still some pain.  Tri-Lock ankle brace for transition keep her out of work for another 4 weeks

## 2024-08-27 ENCOUNTER — Telehealth: Payer: Self-pay | Admitting: Podiatry

## 2024-08-27 NOTE — Telephone Encounter (Signed)
 Patient came in to exchange trilock brace the one from yesterday was too big.

## 2024-09-23 ENCOUNTER — Ambulatory Visit: Payer: Worker's Compensation | Admitting: Podiatry
